# Patient Record
Sex: Male | Born: 1995 | Hispanic: Yes | Marital: Single | Smoking: Light tobacco smoker
Health system: Southern US, Community
[De-identification: ages and names within clinical notes are randomized; demographics above are authoritative.]

## PROBLEM LIST (undated history)

## (undated) DIAGNOSIS — T782XXA Anaphylactic shock, unspecified, initial encounter: Secondary | ICD-10-CM

## (undated) HISTORY — PX: ABDOMINAL SURGERY: SHX537

---
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
## 2017-08-19 ENCOUNTER — Other Ambulatory Visit: Payer: Self-pay

## 2017-08-19 ENCOUNTER — Encounter: Payer: Self-pay | Admitting: Emergency Medicine

## 2017-08-19 ENCOUNTER — Emergency Department
Admission: EM | Admit: 2017-08-19 | Discharge: 2017-08-19 | Disposition: A | Discharge status: Home / Self Care | Payer: Self-pay | Attending: Emergency Medicine | Admitting: Emergency Medicine

## 2017-08-19 DIAGNOSIS — T7840XA Allergy, unspecified, initial encounter: Secondary | ICD-10-CM

## 2017-08-19 DIAGNOSIS — R0602 Shortness of breath: Secondary | ICD-10-CM | POA: Insufficient documentation

## 2017-08-19 DIAGNOSIS — T63441A Toxic effect of venom of bees, accidental (unintentional), initial encounter: Secondary | ICD-10-CM | POA: Insufficient documentation

## 2017-08-19 DIAGNOSIS — R600 Localized edema: Secondary | ICD-10-CM | POA: Insufficient documentation

## 2017-08-19 DIAGNOSIS — F17200 Nicotine dependence, unspecified, uncomplicated: Secondary | ICD-10-CM | POA: Insufficient documentation

## 2017-08-19 MED ORDER — SODIUM CHLORIDE 0.9 % IV SOLN
Freq: Once | INTRAVENOUS | Status: AC
  Administered 2017-08-19: 16:00:00 via INTRAVENOUS

## 2017-08-19 MED ORDER — DIPHENHYDRAMINE HCL 50 MG/ML IJ SOLN
50.0000 mg | Freq: Once | INTRAMUSCULAR | Status: AC
  Administered 2017-08-19: 50 mg via INTRAVENOUS

## 2017-08-19 MED ORDER — EPINEPHRINE 0.3 MG/0.3ML IJ SOAJ: 0.3000 mg | Freq: Once | INTRAMUSCULAR | 0 refills | Status: AC

## 2017-08-19 MED ORDER — METHYLPREDNISOLONE SODIUM SUCC 125 MG IJ SOLR
INTRAMUSCULAR | Status: AC
  Administered 2017-08-19: 125 mg via INTRAVENOUS
  Filled 2017-08-19: qty 2

## 2017-08-19 MED ORDER — FAMOTIDINE IN NACL 20-0.9 MG/50ML-% IV SOLN
20.0000 mg | Freq: Once | INTRAVENOUS | Status: AC
  Administered 2017-08-19: 20 mg via INTRAVENOUS

## 2017-08-19 MED ORDER — EPINEPHRINE 0.3 MG/0.3ML IJ SOAJ
0.3000 mg | Freq: Once | INTRAMUSCULAR | Status: AC
  Administered 2017-08-19: 0.3 mg via INTRAMUSCULAR

## 2017-08-19 MED ORDER — METHYLPREDNISOLONE SODIUM SUCC 125 MG IJ SOLR
125.0000 mg | Freq: Once | INTRAMUSCULAR | Status: AC
  Administered 2017-08-19: 125 mg via INTRAVENOUS

## 2017-08-19 MED ORDER — FAMOTIDINE IN NACL 20-0.9 MG/50ML-% IV SOLN
INTRAVENOUS | Status: AC
  Administered 2017-08-19: 20 mg via INTRAVENOUS
  Filled 2017-08-19: qty 50

## 2017-08-19 MED ORDER — DIPHENHYDRAMINE HCL 50 MG/ML IJ SOLN
INTRAMUSCULAR | Status: AC
  Administered 2017-08-19: 50 mg via INTRAVENOUS
  Filled 2017-08-19: qty 1

## 2017-08-19 MED ORDER — EPINEPHRINE 0.3 MG/0.3ML IJ SOAJ
INTRAMUSCULAR | Status: AC
  Administered 2017-08-19: 0.3 mg via INTRAMUSCULAR
  Filled 2017-08-19: qty 0.3

## 2017-08-19 NOTE — ED Notes (Signed)
Pt resting more comfortably. Pts facial swelling and generalized hives have decreased in severity. ABCs intact. NAD. Pt denies needs/complaints. Family at bedside. Will continue to monitor.

## 2017-08-19 NOTE — ED Notes (Addendum)
MD at bedside. 

## 2017-08-19 NOTE — ED Triage Notes (Signed)
Pt presents to ED via POV c/o allergic reaction to bee sting. Pt reports being stung to R temple and hives appearing after. C/o shortness of breath, pt able to speak in full sentences without difficulty at this time. O2 99%.

## 2017-08-19 NOTE — ED Provider Notes (Signed)
[facility] Emergency Department Provider Note ____________________________________________   First MD Initiated Contact with Patient 08/19/17 1600     (approximate)  I have reviewed the triage vital signs and the nursing notes.   HISTORY  Chief Complaint Allergic Reaction    HPI Larry Yang is a 22 y.o. male with no significant PMH who presents with a little allergic reaction, acute onset within the last few minutes when the patient was stung by a bee on the face.  Patient reports generalized itching, as well as discomfort in his throat and swelling of his tongue.  He denies shortness of breath.  He denies any prior history of similar allergic reactions.  History reviewed. No pertinent past medical history.  There are no active problems to display for this patient.   History reviewed. No pertinent surgical history.  Prior to Admission medications   Medication Sig Start Date End Date Taking? Authorizing Provider  EPINEPHrine (EPIPEN 2-PAK) 0.3 mg/0.3 mL IJ SOAJ injection Inject 0.3 mLs (0.3 mg total) into the muscle once for 1 dose. 08/19/17 08/19/17  Dionne BucySiadecki, Sofiah Lyne, MD    Allergies Bee venom  History reviewed. No pertinent family history.  Social History Social History   Tobacco Use  . Smoking status: Light Tobacco Smoker  . Smokeless tobacco: Never Used  Substance Use Topics  . Alcohol use: Yes    Comment: occ.  . Drug use: Never    Review of Systems  Constitutional: No fever. Eyes: No visual changes. ENT: Positive for throat discomfort. Cardiovascular: Denies chest pain. Respiratory: Denies shortness of breath. Gastrointestinal: No vomiting.  Genitourinary: Negative for flank pain.  Musculoskeletal: Negative for back pain. Skin: Positive for itching. Neurological: Negative for headache.   ____________________________________________   PHYSICAL EXAM:  VITAL SIGNS: ED Triage Vitals  Enc Vitals Group     BP 08/19/17  1550 139/68     Pulse Rate 08/19/17 1550 (!) 152     Resp 08/19/17 1550 20     Temp 08/19/17 1550 98 F (36.7 C)     Temp Source 08/19/17 1550 Oral     SpO2 08/19/17 1550 100 %     Weight 08/19/17 1550 170 lb (77.1 kg)     Height 08/19/17 1550 5\' 4"  (1.626 m)     Head Circumference --      Peak Flow --      Pain Score 08/19/17 1551 10     Pain Loc --      Pain Edu? --      Excl. in GC? --     Constitutional: Alert and oriented.  Uncomfortable and anxious appearing.   Eyes: Conjunctivae are normal.  Head: Atraumatic. Nose: No congestion/rhinnorhea. Mouth/Throat: Mucous membranes are moist.  Oropharynx clear with no pooled secretions.  Uvula and posterior oropharynx with no swelling.  No significant tongue swelling. Neck: Normal range of motion.  Cardiovascular: Normal rate, regular rhythm. Grossly normal heart sounds.  Good peripheral circulation. Respiratory: Normal respiratory effort.  No retractions. Lungs CTAB. Gastrointestinal: No distention.  Musculoskeletal: Extremities warm and well perfused.  Neurologic:  Normal speech and language. No gross focal neurologic deficits are appreciated.  Skin:  Skin is warm and dry.  Mild diffuse erythema with no specific rash. Psychiatric: Mood and affect are normal. Speech and behavior are normal.  ____________________________________________   LABS (all labs ordered are listed, but only abnormal results are displayed)  Labs Reviewed - No data to display ____________________________________________  EKG   ____________________________________________  RADIOLOGY  ____________________________________________   PROCEDURES  Procedure(s) performed: No  Procedures  Critical Care performed: Yes  CRITICAL CARE Performed by: Dionne BucySebastian Mikahla Wisor   Total critical care time: 20 minutes  Critical care time was exclusive of separately billable procedures and treating other patients.  Critical care was necessary to treat or  prevent imminent or life-threatening deterioration.  Critical care was time spent personally by me on the following activities: development of treatment plan with patient and/or surrogate as well as nursing, discussions with consultants, evaluation of patient's response to treatment, examination of patient, obtaining history from patient or surrogate, ordering and performing treatments and interventions, ordering and review of laboratory studies, ordering and review of radiographic studies, pulse oximetry and re-evaluation of patient's condition.  ____________________________________________   INITIAL IMPRESSION / ASSESSMENT AND PLAN / ED COURSE  Pertinent labs & imaging results that were available during my care of the patient were reviewed by me and considered in my medical decision making (see chart for details).  22 year old male with no PMH presents with symptoms consistent with an allergic reaction to a bee sting on his face a few minutes prior to coming to the emergency department.  The patient reports generalized itching, as well as swelling in his face and discomfort in his throat and tongue.  On exam, the patient is uncomfortable appearing and slightly tachycardic.  His O2 saturation is 100%.  I have able to visualize the oropharynx with a tongue blade and there is no visible swelling.  Patient is swallowing his secretions normally, and his airway is intact.  The remainder of the exam is as described above.  Although the patient's airway is intact, due to the discomfort in his tongue and throat and concern for anaphylaxis I will give epinephrine, as well as Benadryl and steroid.  We will reassess, and observe for several hours.  ----------------------------------------- 7:45 PM on 08/19/2017 -----------------------------------------  The patient's symptoms improved after several minutes.  He is now been observed for almost 4 hours with no recurrence of symptoms.  He feels comfortable  to go home, and is eating a meal.  I will prescribe an EpiPen for the patient, and I discussed its usage with him.  Return precautions given, and he expresses understanding. ____________________________________________   FINAL CLINICAL IMPRESSION(S) / ED DIAGNOSES  Final diagnoses:  Allergic reaction, initial encounter      NEW MEDICATIONS STARTED DURING THIS VISIT:  New Prescriptions   EPINEPHRINE (EPIPEN 2-PAK) 0.3 MG/0.3 ML IJ SOAJ INJECTION    Inject 0.3 mLs (0.3 mg total) into the muscle once for 1 dose.     Note:  This document was prepared using Dragon voice recognition software and may include unintentional dictation errors.    Dionne BucySiadecki, Jalyah Weinheimer, MD 08/19/17 1946

## 2017-08-19 NOTE — Discharge Instructions (Signed)
Use the EpiPen if you ever have a similar allergic reaction involving any difficulty breathing, tightness in her throat, swelling of your tongue, or any similar symptoms.  Return to the ER for any new, worsening, or recurrent allergic reaction similar to this.  You may take Benadryl as needed for itching.

## 2021-07-14 ENCOUNTER — Emergency Department
Admission: EM | Admit: 2021-07-14 | Discharge: 2021-07-14 | Disposition: A | Discharge status: Home / Self Care | Payer: Self-pay | Attending: Emergency Medicine | Admitting: Emergency Medicine

## 2021-07-14 ENCOUNTER — Other Ambulatory Visit: Payer: Self-pay

## 2021-07-14 DIAGNOSIS — Z87891 Personal history of nicotine dependence: Secondary | ICD-10-CM | POA: Insufficient documentation

## 2021-07-14 DIAGNOSIS — T63441A Toxic effect of venom of bees, accidental (unintentional), initial encounter: Secondary | ICD-10-CM

## 2021-07-14 DIAGNOSIS — W57XXXA Bitten or stung by nonvenomous insect and other nonvenomous arthropods, initial encounter: Secondary | ICD-10-CM | POA: Insufficient documentation

## 2021-07-14 DIAGNOSIS — L509 Urticaria, unspecified: Secondary | ICD-10-CM | POA: Insufficient documentation

## 2021-07-14 HISTORY — DX: Anaphylactic shock, unspecified, initial encounter: T78.2XXA

## 2021-07-14 MED ORDER — DEXAMETHASONE SODIUM PHOSPHATE 10 MG/ML IJ SOLN
10.0000 mg | Freq: Once | INTRAMUSCULAR | Status: AC
  Administered 2021-07-14: 10 mg via INTRAVENOUS
  Filled 2021-07-14: qty 1

## 2021-07-14 MED ORDER — FAMOTIDINE IN NACL 20-0.9 MG/50ML-% IV SOLN: 20.0000 mg | Freq: Once | INTRAVENOUS | Status: AC

## 2021-07-14 MED ORDER — FAMOTIDINE 20 MG PO TABS: 20.0000 mg | ORAL_TABLET | Freq: Two times a day (BID) | ORAL | 0 refills | Status: AC

## 2021-07-14 MED ORDER — FAMOTIDINE IN NACL 20-0.9 MG/50ML-% IV SOLN
INTRAVENOUS | Status: AC
  Administered 2021-07-14: 20 mg via INTRAVENOUS
  Filled 2021-07-14: qty 50

## 2021-07-14 MED ORDER — METHYLPREDNISOLONE SODIUM SUCC 125 MG IJ SOLR
INTRAMUSCULAR | Status: AC
  Administered 2021-07-14: 125 mg
  Filled 2021-07-14: qty 2

## 2021-07-14 MED ORDER — EPINEPHRINE 0.3 MG/0.3ML IJ SOAJ: 0.3000 mg | INTRAMUSCULAR | 1 refills | Status: AC | PRN

## 2021-07-14 MED ORDER — PREDNISONE 10 MG PO TABS: ORAL_TABLET | ORAL | 0 refills | Status: DC

## 2021-07-14 MED ORDER — EPINEPHRINE 0.3 MG/0.3ML IJ SOAJ
0.3000 mg | Freq: Once | INTRAMUSCULAR | Status: AC
  Administered 2021-07-14: 0.3 mg via INTRAMUSCULAR

## 2021-07-14 MED ORDER — SODIUM CHLORIDE 0.9 % IV BOLUS
1000.0000 mL | Freq: Once | INTRAVENOUS | Status: AC
  Administered 2021-07-14: 1000 mL via INTRAVENOUS

## 2021-07-14 MED ORDER — DIPHENHYDRAMINE HCL 50 MG/ML IJ SOLN
INTRAMUSCULAR | Status: AC
  Administered 2021-07-14: 50 mg
  Filled 2021-07-14: qty 1

## 2021-07-14 NOTE — ED Triage Notes (Signed)
Pt arrives to ED after bee sting. Hives all over body. Airway clear. Epipen given R thigh and IV started.

## 2021-07-14 NOTE — Discharge Instructions (Addendum)
Continue taking the Pepcid twice a day until finished and start the prednisone tapering dose decreasing by 1 tablet each day.  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  Follow-up with your primary care provider.  If any bee stings you will need to be seen at the closest emergency department given your history of bee sting allergies.

## 2021-07-14 NOTE — ED Provider Notes (Signed)
On my exam patient stated that he did feel like his insides were getting better after the epi pen. Continues to have hives. Discussed with patient importance of letting staff know if he is starting to feel worse.    Phineas Semen, MD 07/14/21 1243

## 2021-07-14 NOTE — ED Provider Notes (Signed)
[facility] Provider Note    Event Date/Time   First MD Initiated Contact with Patient 07/14/21 [phone]     (approximate)   History   Urticaria and Allergic Reaction   HPI  Larry Yang is a 26 y.o. male   presents to the ED immediately after being stung by bee.  Patient has a history of allergic reactions and began having hives all over his body.  He states that in the past he was told to come immediately to the emergency department as he had a bad reaction but does not carry an EpiPen.  Currently patient states he is not having any difficulty talking or breathing.  He was able to take Benadryl 50 mg p.o. prior to arrival.  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      Physical Exam   Triage Vital Signs: ED Triage Vitals  Enc Vitals Group     BP      Pulse      Resp      Temp      Temp src      SpO2      Weight      Height      Head Circumference      Peak Flow      Pain Score      Pain Loc      Pain Edu?      Excl. in GC?     Most recent vital signs: Vitals:   07/14/21 1209 07/14/21 1230  BP: (!) 140/105 136/84  Pulse: (!) 110 100  Resp: 20 14  Temp:    SpO2: 97% 98%     General: Awake, no distress.  Able to answer questions in complete sentences without any respiratory difficulty. CV:  Good peripheral perfusion.  Resp:  Normal effort.  Minimal expiratory wheeze heard more in the right lung field.  Patient is able to talk in complete sentences without any difficulty. Abd:  No distention.  Other:  Moderate urticarial areas over the trunk, upper and lower extremities.  No oral edema or difficulty swallowing noted during exam.  No facial swelling.   ED Results / Procedures / Treatments   Labs (all labs ordered are listed, but only abnormal results are displayed) Labs Reviewed - No data to display    PROCEDURES:  Critical Care performed:   Procedures   MEDICATIONS ORDERED IN  ED: Medications  EPINEPHrine (EPI-PEN) injection 0.3 mg (0.3 mg Intramuscular Given 07/14/21 0748)  diphenhydrAMINE (BENADRYL) 50 MG/ML injection (50 mg  Given 07/14/21 0802)  methylPREDNISolone sodium succinate (SOLU-MEDROL) 125 mg/2 mL injection (125 mg  Given 07/14/21 0802)  dexamethasone (DECADRON) injection 10 mg (10 mg Intravenous Given 07/14/21 0806)  famotidine (PEPCID) IVPB 20 mg premix (0 mg Intravenous Stopped 07/14/21 0849)  sodium chloride 0.9 % bolus 1,000 mL (0 mLs Intravenous Stopped 07/14/21 1019)  sodium chloride 0.9 % bolus 1,000 mL (0 mLs Intravenous Stopped 07/14/21 1247)     IMPRESSION / MDM / ASSESSMENT AND PLAN / ED COURSE  I reviewed the triage vital signs and the nursing notes. ----------------------------------------- 12:33 PM on 07/14/2021 ----------------------------------------- Patient is feeling much better.  We discussed carrying an EpiPen with him at all times due to his allergies to bees.  Differential diagnosis includes, but is not limited to, anaphylaxis, urticaria, allergy to bee stings.  26 year old male presents to the ED immediately after being stung by a bee.  Patient has  a history of allergies and has a history of anaphylaxis as well.  Patient states that the last time he came to the emergency department he was not given an EpiPen however he did take Benadryl prior to arrival.  Patient was obviously having an allergic reaction to the bee sting and was given epi 0.3 mg immediately when he arrived to the exam room.  Also a IV was started with Pepcid 20 mg, Decadron 10 mg IV was given.  Also Solu-Medrol 125 mg and Benadryl were given.  Patient was reassessed multiple times frequently and did not have any continued oral swelling or difficulty breathing.  His urticarial lesions began fading and prior to discharge were completely gone.  O2 sats stayed at 97% or above and patient's vital signs were stable but slightly tachycardic when he came to the ED initially.   Patient was given IV hydration as he had been working outside and was covered with sweat as well.  Patient did well.  We discussed the importance of having an EpiPen and Benadryl with him.  He was reassured that he did the right thing by coming to the emergency department immediately.  He was discharged with prescription for Pepcid, prednisone taper and a prescription for an EpiPen.  He is aware that he will need to return to the emergency department should he develop any difficulty breathing immediately.      Patient's presentation is most consistent with acute presentation with potential threat to life or bodily function.  FINAL CLINICAL IMPRESSION(S) / ED DIAGNOSES   Final diagnoses:  Allergic reaction to bee sting     Rx / DC Orders   ED Discharge Orders          Ordered    EPINEPHrine (EPIPEN 2-PAK) 0.3 mg/0.3 mL IJ SOAJ injection  As needed        07/14/21 1237    famotidine (PEPCID) 20 MG tablet  2 times daily        07/14/21 1237    predniSONE (DELTASONE) 10 MG tablet        07/14/21 1237             Note:  This document was prepared using Dragon voice recognition software and may include unintentional dictation errors.   Tommi Rumps, PA-C 07/14/21 1510    Phineas Semen, MD 07/14/21 [id number]

## 2021-07-14 NOTE — ED Notes (Signed)
Pt up to bathroom. Hives are gone. Pt states he feels jittery.

## 2021-07-14 NOTE — ED Notes (Signed)
ED provider at bedside.

## 2021-07-16 ENCOUNTER — Telehealth: Payer: Self-pay | Admitting: Emergency Medicine

## 2022-05-17 ENCOUNTER — Encounter: Payer: Self-pay | Admitting: Emergency Medicine

## 2022-05-17 ENCOUNTER — Emergency Department
Admission: EM | Admit: 2022-05-17 | Discharge: 2022-05-17 | Discharge status: Against Medical Advice | Payer: Self-pay | Attending: Emergency Medicine | Admitting: Emergency Medicine

## 2022-05-17 DIAGNOSIS — Z5321 Procedure and treatment not carried out due to patient leaving prior to being seen by health care provider: Secondary | ICD-10-CM | POA: Insufficient documentation

## 2022-05-17 DIAGNOSIS — R21 Rash and other nonspecific skin eruption: Secondary | ICD-10-CM | POA: Insufficient documentation

## 2022-05-17 DIAGNOSIS — R1030 Lower abdominal pain, unspecified: Secondary | ICD-10-CM | POA: Insufficient documentation

## 2022-05-17 LAB — CBC
HCT: 43.1 % (ref 39.0–52.0)
Hemoglobin: 14.8 g/dL (ref 13.0–17.0)
MCH: 29.6 pg (ref 26.0–34.0)
MCHC: 34.3 g/dL (ref 30.0–36.0)
MCV: 86.2 fL (ref 80.0–100.0)
Platelets: 322 10*3/uL (ref 150–400)
RBC: 5 MIL/uL (ref 4.22–5.81)
RDW: 12.6 % (ref 11.5–15.5)
WBC: 9.4 10*3/uL (ref 4.0–10.5)
nRBC: 0 % (ref 0.0–0.2)

## 2022-05-17 LAB — URINALYSIS, ROUTINE W REFLEX MICROSCOPIC
Bilirubin Urine: NEGATIVE
Glucose, UA: NEGATIVE mg/dL
Hgb urine dipstick: NEGATIVE
Ketones, ur: NEGATIVE mg/dL
Leukocytes,Ua: NEGATIVE
Nitrite: NEGATIVE
Protein, ur: NEGATIVE mg/dL
Specific Gravity, Urine: >1.03 — ABNORMAL HIGH (ref 1.005–1.030)
Squamous Epithelial / HPF: NONE SEEN /HPF (ref 0–5)
pH: 5 (ref 5.0–8.0)

## 2022-05-17 LAB — COMPREHENSIVE METABOLIC PANEL
ALT: 52 U/L — ABNORMAL HIGH (ref 0–44)
AST: 23 U/L (ref 15–41)
Albumin: 4 g/dL (ref 3.5–5.0)
Alkaline Phosphatase: 81 U/L (ref 38–126)
Anion gap: 9 (ref 5–15)
BUN: 10 mg/dL (ref 6–20)
CO2: 24 mmol/L (ref 22–32)
Calcium: 8.5 mg/dL — ABNORMAL LOW (ref 8.9–10.3)
Chloride: 106 mmol/L (ref 98–111)
Creatinine, Ser: 0.92 mg/dL (ref 0.61–1.24)
GFR, Estimated: >60 mL/min (ref >60)
Glucose, Bld: 119 mg/dL — ABNORMAL HIGH (ref 70–99)
Potassium: 3.5 mmol/L (ref 3.5–5.1)
Sodium: 139 mmol/L (ref 135–145)
Total Bilirubin: 0.6 mg/dL (ref 0.3–1.2)
Total Protein: 6.8 g/dL (ref 6.5–8.1)

## 2022-05-17 LAB — LIPASE, BLOOD: Lipase: 41 U/L (ref 11–51)

## 2022-05-17 NOTE — ED Notes (Signed)
No answer when called several times from lobby 

## 2022-05-17 NOTE — ED Triage Notes (Signed)
Pt presents ambulatory to triage via POV with complaints of lower abdominal pain for the last several days. He notes a rash tot the R side of his abdomen for the last month. Pt has no visible rash on his stomach at this time but has stretch marks that are red in color. Pt denies any pain at this time but wanted to get "checked out". A&Ox4 at this time. Denies CP or SOB.

## 2022-07-05 ENCOUNTER — Emergency Department
Admission: EM | Admit: 2022-07-05 | Discharge: 2022-07-05 | Disposition: A | Discharge status: Home / Self Care | Payer: Self-pay | Attending: Emergency Medicine | Admitting: Emergency Medicine

## 2022-07-05 ENCOUNTER — Other Ambulatory Visit: Payer: Self-pay

## 2022-07-05 DIAGNOSIS — T782XXA Anaphylactic shock, unspecified, initial encounter: Secondary | ICD-10-CM | POA: Insufficient documentation

## 2022-07-05 MED ORDER — FAMOTIDINE IN NACL 20-0.9 MG/50ML-% IV SOLN
20.0000 mg | Freq: Once | INTRAVENOUS | Status: AC
  Administered 2022-07-05: 20 mg via INTRAVENOUS
  Filled 2022-07-05: qty 50

## 2022-07-05 MED ORDER — EPINEPHRINE 0.3 MG/0.3ML IJ SOAJ: 0.3000 mg | INTRAMUSCULAR | 3 refills | Status: DC | PRN

## 2022-07-05 MED ORDER — DIPHENHYDRAMINE HCL 50 MG/ML IJ SOLN
50.0000 mg | INTRAMUSCULAR | Status: AC
  Administered 2022-07-05: 50 mg via INTRAVENOUS
  Filled 2022-07-05: qty 1

## 2022-07-05 MED ORDER — EPINEPHRINE 0.3 MG/0.3ML IJ SOAJ
INTRAMUSCULAR | Status: AC
  Filled 2022-07-05: qty 0.3

## 2022-07-05 MED ORDER — SODIUM CHLORIDE 0.9 % IV BOLUS
1000.0000 mL | Freq: Once | INTRAVENOUS | Status: AC
  Administered 2022-07-05: 1000 mL via INTRAVENOUS

## 2022-07-05 MED ORDER — METHYLPREDNISOLONE SODIUM SUCC 125 MG IJ SOLR
125.0000 mg | INTRAMUSCULAR | Status: AC
  Administered 2022-07-05: 125 mg via INTRAVENOUS
  Filled 2022-07-05: qty 2

## 2022-07-05 MED ORDER — EPINEPHRINE 0.3 MG/0.3ML IJ SOAJ
0.3000 mg | Freq: Once | INTRAMUSCULAR | Status: AC
  Administered 2022-07-05: 0.3 mg via INTRAMUSCULAR

## 2022-07-05 MED ORDER — PREDNISONE 20 MG PO TABS: 40.0000 mg | ORAL_TABLET | Freq: Every day | ORAL | 0 refills | Status: AC

## 2022-07-05 NOTE — ED Provider Notes (Signed)
[facility] Provider Note    Event Date/Time   First MD Initiated Contact with Patient 07/05/22 1807     (approximate)   History   Chief Complaint: Allergic Reaction   HPI  Larry Yang is a 27 y.o. male with a past history of bee allergy who reports being stung by a bee twice today, and the front of the throat and on the upper lip.  Reports trouble breathing.  No vomiting.  No rash.  Does have localized swelling in those areas.  Ran out of EpiPen at home and has not taken any medication so far.  Exposure happened just prior to arrival.     Physical Exam   Triage Vital Signs: ED Triage Vitals  Enc Vitals Group     BP 07/05/22 1800 (!) 155/93     Pulse Rate 07/05/22 1759 85     Resp 07/05/22 1759 17     Temp 07/05/22 1759 99.3 F (37.4 C)     Temp Source 07/05/22 1759 Oral     SpO2 07/05/22 1759 100 %     Weight 07/05/22 1801 280 lb (127 kg)     Height 07/05/22 1801 5\' 4"  (1.626 m)     Head Circumference --      Peak Flow --      Pain Score --      Pain Loc --      Pain Edu? --      Excl. in GC? --     Most recent vital signs: Vitals:   07/05/22 2200 07/05/22 2230  BP: 124/71 (!) 137/92  Pulse: 72 62  Resp: 19 18  Temp:    SpO2: 97% 95%    General: Awake, no distress.  CV:  Good peripheral perfusion.  Regular rate rhythm Resp:  Normal effort.  Clear to auscultation, no wheezing or stridor Abd:  No distention.  Soft nontender Other:  There is edema of the upper lip as well as a 4 cm area of swelling over the area of the thyroid cartilage.  Oropharynx is clear.  No tongue elevation or edema.   ED Results / Procedures / Treatments   Labs (all labs ordered are listed, but only abnormal results are displayed) Labs Reviewed - No data to display   EKG    RADIOLOGY    PROCEDURES:  Procedures   MEDICATIONS ORDERED IN ED: Medications  EPINEPHrine (EPI-PEN) injection 0.3 mg (0.3 mg Intramuscular Given 07/05/22 1808)   diphenhydrAMINE (BENADRYL) injection 50 mg (50 mg Intravenous Given 07/05/22 1816)  methylPREDNISolone sodium succinate (SOLU-MEDROL) 125 mg/2 mL injection 125 mg (125 mg Intravenous Given 07/05/22 1816)  famotidine (PEPCID) IVPB 20 mg premix (0 mg Intravenous Stopped 07/05/22 1856)  sodium chloride 0.9 % bolus 1,000 mL (0 mLs Intravenous Stopped 07/05/22 2113)     IMPRESSION / MDM / ASSESSMENT AND PLAN / ED COURSE  I reviewed the triage vital signs and the nursing notes.    Patient's presentation is most consistent with acute presentation with potential threat to life or bodily function.  Patient presents with symptoms of anaphylactic reaction to bee/wasp sting.  Will give EpiPen, Solu-Medrol, Benadryl, Pepcid and observe in ED.  Vital signs are normal.   Clinical Course as of 07/05/22 2301  Tue Jul 05, 2022  2000 Patient reassessed.  No worsening of swelling.  Lungs clear to auscultation without wheezing or stridor.  tissues around the neck are soft.  Voice is strong and clear [PS]  2236  Feeling better.  Edema improved.  Lungs clear to auscultation bilaterally, oropharynx clear.  Stable for discharge. [PS]    Clinical Course User Index [PS] Sharman Cheek, MD     FINAL CLINICAL IMPRESSION(S) / ED DIAGNOSES   Final diagnoses:  Anaphylaxis, initial encounter     Rx / DC Orders   ED Discharge Orders          Ordered    EPINEPHrine 0.3 mg/0.3 mL IJ SOAJ injection  As needed        07/05/22 2236    predniSONE (DELTASONE) 20 MG tablet  Daily with breakfast        07/05/22 2236    Ambulatory Referral to Primary Care (Establish Care)        07/05/22 2236             Note:  This document was prepared using Dragon voice recognition software and may include unintentional dictation errors.   Sharman Cheek, MD 07/05/22 2302

## 2022-07-05 NOTE — ED Triage Notes (Signed)
Pt sts that he was stung by a but and is having swelling to the throat and lips. Pt sts that he is having trouble breathing at this time. Pt is A/Ox4

## 2022-07-05 NOTE — ED Notes (Addendum)
Pt disconnected to go to the restroom. States that he is beginning to feel better

## 2022-07-05 NOTE — ED Notes (Signed)
Pt reports he fells like his throat is swelling more. MD notified. Airway remains patent. No difficulty breathing. Pt still able to swallow

## 2022-07-05 NOTE — ED Notes (Signed)
Pt reports symptoms continually improving

## 2022-07-05 NOTE — ED Notes (Signed)
Pt given water with MD's approval

## 2022-11-22 NOTE — ED Provider Notes (Signed)
 ED Attending Note [facility] Emergency Department  I saw the patient on 11/23/2022.    Patient Name: Larry Yang Chief Complaint: Abdominal Pain    ED Clinical Impression   Final diagnoses:  Sigmoid diverticulitis (Primary)       Medical Decision Making, Impression, ED Course, Assessment and Plan    -I have reviewed the outside/prior medical records including 02/10/2022 [facility] SurgTrauma Discharge Summary Note for patient previous history of similar symptoms in the setting of a perirectal abscess which was surgically removed.  -Imaging reviewed and interpreted by me showed acute sigmoid diverticulitis. -Laboratory workup reviewed by me showed leukocytosis to 18.2. Urinalysis was remarkable for mucus. CMP reveals elevated total protein to 8.3. Lipase is WNL.  -MAO was consulted to aid in the evaluation and management of this patient  -Patient/caregiver discussions:  -Social Determinants of health which significantly affected care: N/A  -Indications for observation/admission (or consideration of observation/admission) and/or appropriateness for outpatient management: Patient is appropriate for admission given the findings noted in the ED course and MDM.     Larry Yang is a 27 y.o. male with no pertinent PMH presenting with 3 days of sharp, worsening lower abdominal pain which was initially intermittent but is now constant with associated dysuria, hematuria, emesis, diarrhea, and reduced PO intake. Has left lower quadrant tenderness palpation with mild rebound tenderness. Initially afebrile though did become febrile here. Differential diagnosis is brought includes recurrent intra-abdominal abscess, diverticulitis, inflammatory bowel disease, urinary tract infection or stone. Given focal tenderness on exam will obtain CT imaging. Laboratory workup is notable for leukocytosis.   ED Course:  ED Course as of 11/23/22 0029  Tue Nov 22, 2022  2131 This patient  now with fever and persistent pain. Will start IV antibiotics given fever and concern for early sepsis and plan on admission for IV antibiotics and pain control  CT imaging demonstrated findings consistent with sigmoid diverticulitis. There is no evidence of perforation or abscess, but given patient's fever and significant pain, does warrant admission for IV antibiotics, pain control and further care. Have discussed with hospitalist Dr. Jackson.    History  History obtained from: Patient  Larry Yang is a 27 y.o. male with no pertinent PMH presenting with abdominal pain. The patient reports 3 days of sharp, worsening lower abdominal pain which was initially intermittent but is now constant. He states he experienced several episodes of emesis and diarrhea on the day his pain onset but none in the days that followed due to significantly reduced PO intake. Upon interview, he endorses dysuria, as well as hematuria which decreased after the first day of his pain. He also notes bilateral lower back pain which onset simultaneously with his abdominal pain. He denies tobacco or EtOH use. He is sexually active with 1 partner and does not use contraceptives. No recent cough, congestion, sore throat, or fevers.   Per chart review, the patient was seen in this ED on 02/06/2022 for similar symptoms in addition to perirectal pain. Imaging revealed a 3 cm perirectal abscess. He was treated with Zosyn and admitted for I&D on 02/07/2022. He tolerated the procedure well. At discharge, he was transitioned to a 7-day course of Augmentin.   Past Medical/Past Surgical History:  Reviewed in EHR including nursing documentation as outlined. Pertinent PMH/PSH also noted above in HPI.  No past medical history on file. Patient Active Problem List  Diagnosis  . Diverticulitis   Past Surgical History:  Procedure Laterality Date  .  PR I&D PERIRECTAL ABSCESS N/A 02/07/2022   Procedure: INCISION AND DRAINAGE OF  ISCHIORECTAL AND/OR PERIRECTAL ABSCESS (SEPARATE PROCEDURE);  Surgeon: Karyle Donnice Pac, MD;  Location: MAIN OR [facility];  Service: Trauma  . PR PROCTOSIGMOIDOSCOPY,BIOPSY Midline 02/07/2022   Procedure: PROCTOSIGMOIDOSCOPY RIGID; W/BX 1/MX;  Surgeon: Karyle Donnice Pac, MD;  Location: MAIN OR [facility];  Service: Trauma    Social History/Family History:  Reviewed in EMR, I agree with nursing documentation. Additional pertinent social and family history noted in HPI.  Social History   Tobacco Use  . Smoking status: Never  . Smokeless tobacco: Never  Vaping Use  . Vaping status: Never Used  Substance Use Topics  . Alcohol use: Yes    Comment: pccasopma;  . Drug use: No   History reviewed. No pertinent family history.  ROS A review of systems reviewed and are negative except as stated in the HPI or noted below. Home Medications: No current facility-administered medications for this encounter.  Current Outpatient Medications:  .  acetaminophen  (TYLENOL ) 500 MG tablet, Take 2 tablets (1,000 mg total) by mouth every eight (8) hours as needed for pain., Disp: 40 tablet, Rfl: 0 .  albuterol  2.5 mg /3 mL (0.083 %) nebulizer solution, Inhale 3 mL (2.5 mg total) by nebulization every four (4) hours as needed for wheezing or shortness of breath for up to 10 doses., Disp: 30 mL, Rfl: 0 .  albuterol  HFA 90 mcg/actuation inhaler, Inhale 2 puffs every four (4) hours as needed for wheezing., Disp: 8 g, Rfl: 2 .  EPINEPHrine  (EPIPEN ) 0.3 mg/0.3 mL injection, Inject 0.3 mL (0.3 mg total) into the muscle., Disp: , Rfl:  .  fluticasone propionate (FLONASE) 50 mcg/actuation nasal spray, 2 sprays into each nostril daily., Disp: 16 g, Rfl: 0 .  gabapentin (NEURONTIN) 300 MG capsule, Take 1 capsule (300 mg total) by mouth Three (3) times a day., Disp: 90 capsule, Rfl: 0 .  HYDROmorphone  (DILAUDID ) 2 MG tablet, Take 1-2 tablets by mouth every 6 hours as needed for severe pain., Disp: 20 tablet, Rfl:  0  ALLERGIES:  Venom-honey bee    Physical Exam   ED Triage Vitals  Enc Vitals Group     BP 11/22/22 1426 158/84     Heart Rate 11/22/22 1420 128     SpO2 Pulse --      Resp 11/22/22 1425 18     Temp 11/22/22 1425 36.6 C (97.8 F)     Temp Source 11/22/22 1425 Oral     SpO2 11/22/22 1420 95 %     Weight 11/22/22 1426 95.3 kg (210 lb)     Height 11/22/22 1426 1.626 m (5' 4)    Vital signs reviewed. GENERAL: well-appearing in no acute distress  HEENT: airway is patent, moist mucus membranes, oropharynx clear without lesions, exudates, thrush EYES: pupils equal and reactive to light bilaterally, no scleral icterus, conjunctivae clear CARDIAC: normal rate, regular rhythm, normal S1 and S2, 2+ peripheral pulses, normal capillary refill PULMONARY: normal work of breathing on room air, good air movement bilaterally, lungs are clear to auscultation bilaterally without wheezes, crackles, rhonchi ABDOMINAL: normal bowel sounds, abdomen soft, +LLQ TTP and suprapubic region with +rebound, nondistended, no hepatomegaly or splenomegaly MSK: no joint deformity or swelling, moves all 4 extremities without difficulty, no lower extremity edema NEURO: alert and oriented 3, normal mental status, PERRL, EOMI, moves all four extremities equally, normal gait  SKIN: Warm and dry without rashes, no jaundice    Labs and Radiology  Labs Reviewed  COMPREHENSIVE METABOLIC PANEL - Abnormal; Notable for the following components:      Result Value   Total Protein 8.3 (*)    All other components within normal limits  CBC W/ AUTO DIFF - Abnormal; Notable for the following components:   WBC 18.2 (*)    Absolute Neutrophils 15.1 (*)    Absolute Monocytes 1.4 (*)    All other components within normal limits  URINALYSIS WITH MICROSCOPY WITH CULTURE REFLEX PERFORMABLE - Abnormal; Notable for the following components:   Mucus, UA Rare (*)    All other components within normal limits  LIPASE - Normal   CBC W/ DIFFERENTIAL   Narrative:    The following orders were created for panel order CBC w/ Differential.                 Procedure                               Abnormality         Status                                    ---------                               -----------         ------                                    CBC w/ [id number]]         Abnormal            Final result                                               Please view results for these tests on the individual orders.  URINALYSIS WITH MICROSCOPY WITH CULTURE REFLEX   Narrative:    The following orders were created for panel order Urinalysis with Microscopy with Culture Reflex.                 Procedure                               Abnormality         Status                                    ---------                               -----------         ------                                    Urinalysis with Microsc.SABRASABRA[[id number]]  Abnormal            Final result  Please view results for these tests on the individual orders.   CT Abdomen Pelvis W Contrast  Final Result    Acute sigmoid diverticulitis. Given lack unusual chronological age for this pathology, consider gastroenterology referral to discuss utility for colonoscopy once symptoms have resolved to rule out underlying lesion.    Hepatic steatosis          Procedures  Procedures    Portions of this record have been created using Scientist, clinical (histocompatibility and immunogenetics). Dictation errors have been sought, but may not have been identified and corrected.   Documentation assistance was provided by Coley Emms, Scribe, on November 22, 2022 at 6:04 PM for Leita Chancy, MD  Documentation assistance was provided by the scribe in my presence.  The documentation recorded by the scribe has been reviewed by me and accurately reflects the services I personally performed.    Chancy Leita RAMAN, MD 11/23/22  [phone]

## 2022-11-23 DIAGNOSIS — J4520 Mild intermittent asthma, uncomplicated: Secondary | ICD-10-CM | POA: Insufficient documentation

## 2023-03-06 NOTE — Discharge Summary (Signed)
 ------------------------------------------------------------------------------- Attestation signed by Cass Norleen Rocks, MD at 03/09/23 [phone] I personally evaluated this patient and agree with findings and plan above.  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 *   OR Procedures:   CLOSURE ENTEROSTOMY LG/SM INTESTINE REPAIR OF PARASTOMAL HERNIA, ANY APPROACH (IE, OPEN, LAPAROSCOPIC, ROBOTIC), INITIAL OR RECURRENT, INCLUDING IMPLANTATION OF MESH OR OTHER PROSTHESIS, WHEN PERFORMED; REDUCIBLE Date 03/03/2023 -------------------   Ancillary Procedures: no procedures  Discharge Day Services:   Subjective  No acute events overnight. No fever or chills. Would like to discharge with PO Dilaudid .  Objective  Patient Vitals for the past 8 hrs:  BP Temp Temp src Pulse Resp SpO2  03/07/23 0345 131/92 36.8 C (98.2 F) Oral 75 17 99 %   No intake/output data recorded.  -General:  Appropriate, comfortable and in no apparent distress.  -Neurological: Alert and oriented x3. Moves all 4 extremities spontaneously.  -HEENT: Atraumatic -Cardiovascular: Regular rate -Pulmonary: Normal work of breathing on room air -Abdomen: Soft, non-distended, appropriately tender. No rebound or guarding. Dressing clean, dry, intact. Gauze removed from ostomy site. Ostomy site covered with gauze and tape. -Extremities: Warm, well perfused, normal skin  turgor.  Hospital Course:  Larry Yang is a 28 y.o. male with PMHx of perforated diverticulitis status post diverting loop ileostomy who presented for planned DLI TD on 03/03/2023. Procedures performed were diverting loop ileostomy takedown and primary repair of parastomal hernia. Operative findings were successfully reversed ileostomy; parastomal hernia noted and closed primarily.  The procedure itself was uneventful and without complications. He tolerated the procedure well, was extubated in the OR, and received routine post-operative care before being transferred to the floor. His diet was advanced and by discharge he was tolerating a regular diet. He was voiding adequately, passing gas, having bowel movements, ambulating independently, and his pain was controlled with PO pain medications. He was examined by the Trauma Surgery team on the day of discharge and was deemed suitable for discharge home. He will be discharged 3 Days Post-Op in good condition. He was instructed to cover his ostomy site with 4x4 gauze and tape.  Instructions  DIET: You may eat a regular diet.  ACTIVITY: You may shower 48 hours (2 days) after the procedure, but please avoid baths, hot tubs, pools, soaking or sitting in water for 2 weeks.  Please allow the soap and water to run over your incision.  Do not scrub your incision.     No lifting greater than 10 pounds or strenuous activity for 2 weeks then nothing over 20 pounds from 2-6 weeks. If feeling pulling at incision back off.  Continue restrictions until until seen in clinic.  INCISION: Look at your incisions at least twice a day. A small amount of drainage is normal, especially in the first couple of days after surgery. If you notice any redness around your incisions that spreads along your skin, or any thick, yellow drainage, you should call the clinic. The surgical glue that was placed over your incisions will dissolve naturally over time. Do not put any  ointments or cream over the glue, and do not pull the glue off or pick at it. Avoid wearing tight clothing.  Continue to cover your ostomy site with dry gauze and tape.  PAIN MEDICATION: Do not drive or drink alcohol while taking prescription pain medication. Take your prescribed pain medication only as needed. You may decrease the amount of pain medication as tolerated, and take Tylenol  or a Motrin product as directed on the label if have tolerated these medications before and have not been told by another provider not to take them.  Pain medication can cause constipation. To avoid this, you should take an over-the-counter stool softener, such as docusate 100mg  1-2 times daily.   Effective July 02, 2015, the [place] signed a [place]  law that works to address the opioid crisis our state is facing.  It is called the STOP Law, for Strengthen Opioid Misuse Prevention.     As part of this law, we are limited to prescribing no more than a seven-day supply of opioid pain medicine for our surgery patients initally.  (This law also limits prescriptions for patients who have not had surgery to a five-day supply.)   If you are in need of additional pain control, the best number to call is [phone] before your current medications run out.  For after hours and weekends, please call [phone] and ask to speak with the surgery resident on call. The on-call provider may NOT be able to renew your prescription.   The STOP Act also monitors the amount of controlled substances that each patient has received from any and all providers.  Prescriptions for each patient will be monitored by the  Controlled Substances Reporting System in accordance with the law.  Thank you for working with us  so we can provide good pain control in the safest way possible while you recover.   Please note that prescription pain medication refills will not be called into your pharmacy. If you feel that you are needing more pain  medication, you will need to be seen in clinic or the Emergency Department for further evaluation to ensure you are not experiencing a complication. Please take note of how many pills you have left in your bottle, and if you are starting to run low and feel that you will need more for adequate pain control, to call the clinic as soon as possible to schedule an appointment, as our clinics are very busy, and we cannot guarantee same week appointments.  The Trauma Surgery office number is [phone] (link: [phone]). For emergencies at night or on the week-end, please call [phone]) and ask for the surgery resident on call.  WHEN TO CALL THE SURGEON'S OFFICE: 1. Thick, yellow drainage from your incisions, redness at incisions, bleeding, or separations of wounds. 2. Temperature greater than 101.5 3. Uncontrolled nausea or vomiting. 4. Pain that is not controlled by your pain medication  You may take Motrin 600mg  with breakfast, lunch, dinner and before bed time to help with pain.   Standard Patient Notification Script We care deeply about how our patients' pain is managed after surgery.  During the month after you have surgery, you may receive a call from us  asking about your postsurgical pain and about how you managed that pain.  The survey will take approximately 5 to 10 minutes and will help us  to provide better care to our patients.  Thank you in advance for your participation and please answer any calls coming from the [facility] or  [place] area!  Follow Up  Important Information and Numbers:  [facility] Department of Surgery, Division of General and Acute Care Surgery [address] [facility] - First Floor [place], [place]  [address] During regular business hours: (Monday-Friday, 8am-4:30 pm) call,   Contact Information for Nurses: [phone]: [place] [phone]: [place] (Dr. Francella) [phone]: Office (if you need to change your OR Date) Fax: [phone]  Clinic Number: To schedule, reschedule or cancel an appointment:  [phone]) [phone] - You will be scheduled for an appointment in 2 weeks.  During Nights and Weekends will need to reach Resident on Call: [phone]  If you have any FMLA or other paperwork that needs filled out related to work, school, etc., that hasn't been filled out during your hospitalization, please fax to the number listed above [phone]) instead of bringing to your clinic appointment and this will help decrease any delays in paperwork completion.  We will schedule a follow-up appointment for approximately 2 weeks from discharge. If you have not been contacted regarding an follow-up appointment date and time within a few days, please call to schedule.   Please note, if you arrive more than 20 minutes late, you will need to reschedule your appointment  *For support and information on substance abuse you may call the [facility]. It is free, confidential 24/7, 365 day-a-year treatment referral and information service (in [place] and [place]) for individuals and families facing mental and/or substance use disorders. 1-800-662-HELP (4357).   Condition at Discharge: Improved Discharge Medications:    Medication List    START taking these medications   . celecoxib 200 MG capsule; Commonly known as: CeleBREX; Take 1 capsule  (200 mg total) by mouth two (2) times a day for 3 days. SABRA gabapentin 300 MG capsule; Commonly known as: NEURONTIN; Take 1 capsule  (300 mg total) by mouth Three (3) times a day. . HYDROmorphone  2 MG tablet; Commonly known as: DILAUDID ; Take 1 tablet (2  mg total) by mouth every four (4) hours as needed for severe pain for up  to 5 days. SABRA lidocaine 2 % Soln; Commonly known as: XYLOCAINE; 15 mL by Mouth route  every four (4) hours as needed (mouth pain, switch and spt). . methocarbamol 1,000 mg Tab; Take 1,000 mg by mouth four (4) times a day.   CHANGE how you take these  medications   . acetaminophen  500 MG tablet; Commonly known as: TYLENOL ; Take 2 tablets  (1,000 mg total) by mouth every six (6) hours.; What changed: when to take  this . EPINEPHrine  0.3 mg/0.3 mL injection; Commonly known as: EPIPEN ; Inject  0.3 mL (0.3 mg total) into the muscle once as needed for anaphylaxis for  up to 1 dose.; What changed: when to take this, reasons to take this   CONTINUE taking these medications   . * albuterol  90 mcg/actuation inhaler; Commonly known as: PROVENTIL   HFA;VENTOLIN  HFA; Inhale 2 puffs every four (4) hours as needed for  wheezing. . * albuterol  2.5 mg /3 mL (0.083 %) nebulizer solution; Inhale 3 mL (2.5  mg total) by nebulization every four (4) hours as needed for wheezing or  shortness of breath for up to 10 doses. * This list has 2 medication(s) that are the same as other medications  prescribed for you. Read the directions carefully, and ask your doctor or  other care provider to review them with you.   STOP taking these medications   . ADAPT STOMA POWDER Powd; Generic  drug: ostomy supplies . COLOPLAST BARRIER RING 2  Misc; Generic drug: colostomy washer . NEW IMAGE DRAINABLE POUCH 2 1/4  Misc; Generic drug: colostomy bags . NEW IMAGE DRAINABLE POUCH 2 3/4  Misc . NEW IMAGE FLEXTEND BARRIER 2 1/4  Misc; Generic drug: ostomy supplies . NO STING BARRIER FILM Padm; Generic drug: skin protectants, misc. . OSTOMY BELT LARGE Misc; Generic drug: colostomy belt . ostomy supplies Misc . SENSI-CARE ADHESI REMOVER WIPE Misc; Generic drug: adhesive remover    Pending Test Results: Surgical pathology exam

## 2023-04-13 NOTE — Progress Notes (Signed)
 [facility], ACUTE CARE, and GENERAL SURGERY  - Follow-up Appointment - 04/13/2023    Patient: Larry Yang DOB: 08/12/1995 Primary Care Provider:  Pcp, None Per Patient   CC: Back pain  Hospitalization for: Diverting loop ileostomy reversal on 03/03/23 Discharged on: 03/06/23 Last seen in clinic on 03/31/23 by Suzen Denis, NP  Subjective:   Since he was last seen in clinic on 03/31/23 he continues to eat well and have 2 regular bowel movements daily. Denies pain with bowel movements, nausea or emesis. Denies fevers, chills.   Reports worsening back pain over the past week after moving furniture across the room. Worried that it is a problem with his bowel and the ostomy take down site. Reports feeling muscle strain across his anterior abdomen and concerned he has an abscess or tumor. Worried he needs a CT scan to ensure he doesn't have cancer as his friend did and had a stoma for that reason. He is walking extensively, has not returned to work because he feels like his gait is abnormal since the initial surgery.   Is not taking tylenol  or ibuprofen, taking Gabapentin prescribed at last appointment.   BP 113/71 (BP Position: Sitting, BP Cuff Size: Large)   Pulse 88   Temp 36.4 C (97.6 F) (Temporal)   Ht 165.1 cm (5' 5)   Wt 90 kg (198 lb 8 oz)   BMI 33.03 kg/m  Estimated body mass index is 33.03 kg/m as calculated from the following:   Height as of this encounter: 165.1 cm (5' 5).   Weight as of this encounter: 90 kg (198 lb 8 oz).   Physical Exam:  General: Well appearing male, well-nourished, resting comfortably in no acute distress Eyes: Sclera anicteric, EOM intact ENT: Nares without discharge, moist mucous membranes, trachea midline  Cardiac: Regular rate and rhythm, radial pulse 2+ Pulmonary: Non labored breathing, stable on room air Abdomen: Soft, non-tender, non distended, previous lower midline scar well healed, right sided ostomy takedown site  incision well healed, no erythema, no bulge, no recurrent hernia Extremities: Warm and well perfused, gait appears normal  Neuro: Alert and oriented x3   Imaging Results: - None    Assessment & Plan:   28 yo M w/ hx of perforated diverticulitis s/p Open sigmoidectomy with DLI (11/2022) now s/p diverting loop ileostomy reversal on 03/03/23 who is recovering well post-operatively.   - Discussed extensively that he is doing well from a surgical standpoint, he is eating and having bowel function, incisions are well healed and there are no concerns clinically or on exam. Discussed his surgeries were done for diverticulitis and there is no evidence of cancer. Discussed that he does not need further imaging at this time and his long term back pain needs further follow up with a PCP.  - Take tylenol /ibuprofen scheduled for the next week to assist with back muscle strain  - No further lifting restrictions, can slowly increase your lifting and activity  - Ok to swim/soak in tub - New abdominal binder provided - Referral to PCP given lack of PCP and ongoing questions about back pain - Follow up in clinic on an as needed basis at this time   Lamarr MARLA Mayer, MD [facility] General Surgery, PGY5

## 2023-08-05 ENCOUNTER — Observation Stay (HOSPITAL_BASED_OUTPATIENT_CLINIC_OR_DEPARTMENT_OTHER)
Admit: 2023-08-05 | Discharge: 2023-08-05 | Disposition: A | Discharge status: Home / Self Care | Payer: Self-pay | Attending: Internal Medicine | Admitting: Internal Medicine

## 2023-08-05 ENCOUNTER — Other Ambulatory Visit: Payer: Self-pay

## 2023-08-05 ENCOUNTER — Emergency Department: Payer: Self-pay

## 2023-08-05 ENCOUNTER — Inpatient Hospital Stay (INDEPENDENT_AMBULATORY_CARE_PROVIDER_SITE_OTHER): Payer: Self-pay

## 2023-08-05 ENCOUNTER — Observation Stay
Admission: EM | Admit: 2023-08-05 | Discharge: 2023-08-06 | Disposition: A | Discharge status: Home / Self Care | Payer: Self-pay | Attending: Obstetrics and Gynecology | Admitting: Obstetrics and Gynecology

## 2023-08-05 DIAGNOSIS — D72829 Elevated white blood cell count, unspecified: Secondary | ICD-10-CM | POA: Insufficient documentation

## 2023-08-05 DIAGNOSIS — R008 Other abnormalities of heart beat: Secondary | ICD-10-CM

## 2023-08-05 DIAGNOSIS — Z9889 Other specified postprocedural states: Secondary | ICD-10-CM | POA: Insufficient documentation

## 2023-08-05 DIAGNOSIS — F17200 Nicotine dependence, unspecified, uncomplicated: Secondary | ICD-10-CM | POA: Insufficient documentation

## 2023-08-05 DIAGNOSIS — S270XXA Traumatic pneumothorax, initial encounter: Principal | ICD-10-CM | POA: Insufficient documentation

## 2023-08-05 DIAGNOSIS — Z8719 Personal history of other diseases of the digestive system: Secondary | ICD-10-CM | POA: Insufficient documentation

## 2023-08-05 DIAGNOSIS — R109 Unspecified abdominal pain: Secondary | ICD-10-CM

## 2023-08-05 DIAGNOSIS — S0083XA Contusion of other part of head, initial encounter: Principal | ICD-10-CM

## 2023-08-05 DIAGNOSIS — R079 Chest pain, unspecified: Secondary | ICD-10-CM

## 2023-08-05 DIAGNOSIS — Z932 Ileostomy status: Secondary | ICD-10-CM | POA: Insufficient documentation

## 2023-08-05 DIAGNOSIS — E669 Obesity, unspecified: Secondary | ICD-10-CM | POA: Insufficient documentation

## 2023-08-05 DIAGNOSIS — S01111A Laceration without foreign body of right eyelid and periocular area, initial encounter: Secondary | ICD-10-CM | POA: Insufficient documentation

## 2023-08-05 DIAGNOSIS — J939 Pneumothorax, unspecified: Secondary | ICD-10-CM | POA: Diagnosis present

## 2023-08-05 DIAGNOSIS — Z6830 Body mass index (BMI) 30.0-30.9, adult: Secondary | ICD-10-CM | POA: Insufficient documentation

## 2023-08-05 DIAGNOSIS — R0789 Other chest pain: Secondary | ICD-10-CM | POA: Insufficient documentation

## 2023-08-05 LAB — CBC
HCT: 47.6 % (ref 39.0–52.0)
HCT: 39.1 % (ref 39.0–52.0)
Hemoglobin: 16.7 g/dL (ref 13.0–17.0)
Hemoglobin: 13.7 g/dL (ref 13.0–17.0)
MCH: 28.9 pg (ref 26.0–34.0)
MCH: 29.1 pg (ref 26.0–34.0)
MCHC: 35.1 g/dL (ref 30.0–36.0)
MCHC: 35 g/dL (ref 30.0–36.0)
MCV: 82.4 fL (ref 80.0–100.0)
MCV: 83.2 fL (ref 80.0–100.0)
Platelets: 452 K/uL — ABNORMAL HIGH (ref 150–400)
Platelets: 294 K/uL (ref 150–400)
RBC: 5.78 MIL/uL (ref 4.22–5.81)
RBC: 4.7 MIL/uL (ref 4.22–5.81)
RDW: 13 % (ref 11.5–15.5)
RDW: 13.1 % (ref 11.5–15.5)
WBC: 20.6 K/uL — ABNORMAL HIGH (ref 4.0–10.5)
WBC: 12.2 K/uL — ABNORMAL HIGH (ref 4.0–10.5)
nRBC: 0 % (ref 0.0–0.2)
nRBC: 0 % (ref 0.0–0.2)

## 2023-08-05 LAB — ECHOCARDIOGRAM COMPLETE
AR max vel: 2.39 cm2
AV Peak grad: 11.3 mmHg
Ao pk vel: 1.68 m/s
Area-P 1/2: 3.77 cm2
S' Lateral: 3.4 cm

## 2023-08-05 LAB — COMPREHENSIVE METABOLIC PANEL WITH GFR
ALT: 35 U/L (ref 0–44)
AST: 31 U/L (ref 15–41)
Albumin: 4.3 g/dL (ref 3.5–5.0)
Alkaline Phosphatase: 80 U/L (ref 38–126)
Anion gap: 18 — ABNORMAL HIGH (ref 5–15)
BUN: 8 mg/dL (ref 6–20)
CO2: 16 mmol/L — ABNORMAL LOW (ref 22–32)
Calcium: 9.1 mg/dL (ref 8.9–10.3)
Chloride: 105 mmol/L (ref 98–111)
Creatinine, Ser: 1.07 mg/dL (ref 0.61–1.24)
GFR, Estimated: >60 mL/min (ref >60)
Glucose, Bld: 131 mg/dL — ABNORMAL HIGH (ref 70–99)
Potassium: 4 mmol/L (ref 3.5–5.1)
Sodium: 139 mmol/L (ref 135–145)
Total Bilirubin: 0.4 mg/dL (ref 0.0–1.2)
Total Protein: 8.2 g/dL — ABNORMAL HIGH (ref 6.5–8.1)

## 2023-08-05 LAB — PROTIME-INR
INR: 1 (ref 0.8–1.2)
Prothrombin Time: 13.8 s (ref 11.4–15.2)

## 2023-08-05 LAB — BASIC METABOLIC PANEL WITH GFR
Anion gap: 8 (ref 5–15)
BUN: 13 mg/dL (ref 6–20)
CO2: 24 mmol/L (ref 22–32)
Calcium: 8.4 mg/dL — ABNORMAL LOW (ref 8.9–10.3)
Chloride: 105 mmol/L (ref 98–111)
Creatinine, Ser: 1.02 mg/dL (ref 0.61–1.24)
GFR, Estimated: >60 mL/min (ref >60)
Glucose, Bld: 99 mg/dL (ref 70–99)
Potassium: 3.6 mmol/L (ref 3.5–5.1)
Sodium: 137 mmol/L (ref 135–145)

## 2023-08-05 LAB — HIV ANTIBODY (ROUTINE TESTING W REFLEX): HIV Screen 4th Generation wRfx: NONREACTIVE

## 2023-08-05 LAB — APTT: aPTT: 30 s (ref 24–36)

## 2023-08-05 LAB — HEMOGLOBIN: Hemoglobin: 14.5 g/dL (ref 13.0–17.0)

## 2023-08-05 LAB — TROPONIN I (HIGH SENSITIVITY): Troponin I (High Sensitivity): 5 ng/L (ref <18)

## 2023-08-05 MED ORDER — ACETAMINOPHEN 325 MG PO TABS: 650.0000 mg | ORAL_TABLET | Freq: Four times a day (QID) | ORAL | Status: DC | PRN

## 2023-08-05 MED ORDER — HYDROMORPHONE HCL 1 MG/ML IJ SOLN
1.0000 mg | Freq: Once | INTRAMUSCULAR | Status: AC
  Administered 2023-08-05: 1 mg via INTRAVENOUS
  Filled 2023-08-05: qty 1

## 2023-08-05 MED ORDER — METOCLOPRAMIDE HCL 5 MG/ML IJ SOLN
10.0000 mg | Freq: Once | INTRAMUSCULAR | Status: AC
  Administered 2023-08-05: 10 mg via INTRAVENOUS
  Filled 2023-08-05: qty 2

## 2023-08-05 MED ORDER — ACETAMINOPHEN 650 MG RE SUPP: 650.0000 mg | Freq: Four times a day (QID) | RECTAL | Status: DC | PRN

## 2023-08-05 MED ORDER — MORPHINE SULFATE (PF) 4 MG/ML IV SOLN
4.0000 mg | Freq: Once | INTRAVENOUS | Status: AC
  Administered 2023-08-05: 4 mg via INTRAVENOUS
  Filled 2023-08-05: qty 1

## 2023-08-05 MED ORDER — OXYCODONE-ACETAMINOPHEN 5-325 MG PO TABS
1.0000 | ORAL_TABLET | Freq: Once | ORAL | Status: AC
  Administered 2023-08-05: 1 via ORAL
  Filled 2023-08-05: qty 1

## 2023-08-05 MED ORDER — ALBUTEROL SULFATE (2.5 MG/3ML) 0.083% IN NEBU: 2.5000 mg | INHALATION_SOLUTION | RESPIRATORY_TRACT | Status: DC | PRN

## 2023-08-05 MED ORDER — FENTANYL CITRATE PF 50 MCG/ML IJ SOSY
12.5000 ug | PREFILLED_SYRINGE | INTRAMUSCULAR | Status: DC | PRN
  Administered 2023-08-05: 50 ug via INTRAVENOUS
  Administered 2023-08-05: 25 ug via INTRAVENOUS
  Administered 2023-08-06 (×2): 50 ug via INTRAVENOUS
  Filled 2023-08-05 (×4): qty 1

## 2023-08-05 MED ORDER — PANTOPRAZOLE SODIUM 40 MG IV SOLR
40.0000 mg | INTRAVENOUS | Status: DC
  Administered 2023-08-05 – 2023-08-06 (×2): 40 mg via INTRAVENOUS
  Filled 2023-08-05 (×2): qty 10

## 2023-08-05 MED ORDER — ONDANSETRON HCL 4 MG/2ML IJ SOLN
4.0000 mg | Freq: Once | INTRAMUSCULAR | Status: AC
  Administered 2023-08-05: 4 mg via INTRAVENOUS
  Filled 2023-08-05: qty 2

## 2023-08-05 MED ORDER — IOHEXOL 350 MG/ML SOLN
100.0000 mL | Freq: Once | INTRAVENOUS | Status: AC | PRN
  Administered 2023-08-05: 100 mL via INTRAVENOUS

## 2023-08-05 MED ORDER — FAMOTIDINE 20 MG PO TABS
20.0000 mg | ORAL_TABLET | Freq: Two times a day (BID) | ORAL | Status: DC
  Administered 2023-08-05 – 2023-08-06 (×2): 20 mg via ORAL
  Filled 2023-08-05 (×3): qty 1

## 2023-08-05 MED ORDER — ACETAMINOPHEN 325 MG PO TABS
650.0000 mg | ORAL_TABLET | Freq: Once | ORAL | Status: AC
  Administered 2023-08-05: 650 mg via ORAL
  Filled 2023-08-05: qty 2

## 2023-08-05 MED ORDER — SODIUM CHLORIDE 0.9 % IV BOLUS
500.0000 mL | Freq: Once | INTRAVENOUS | Status: AC
  Administered 2023-08-05: 500 mL via INTRAVENOUS

## 2023-08-05 MED ORDER — OXYCODONE HCL 5 MG PO TABS
5.0000 mg | ORAL_TABLET | ORAL | Status: DC | PRN
  Administered 2023-08-05 – 2023-08-06 (×5): 5 mg via ORAL
  Filled 2023-08-05 (×6): qty 1

## 2023-08-05 MED ORDER — ONDANSETRON HCL 4 MG/2ML IJ SOLN: 4.0000 mg | Freq: Four times a day (QID) | INTRAMUSCULAR | Status: DC | PRN

## 2023-08-05 MED ORDER — SODIUM CHLORIDE 0.9 % IV BOLUS
1000.0000 mL | Freq: Once | INTRAVENOUS | Status: AC
  Administered 2023-08-05: 1000 mL via INTRAVENOUS

## 2023-08-05 MED ORDER — ONDANSETRON HCL 4 MG PO TABS: 4.0000 mg | ORAL_TABLET | Freq: Four times a day (QID) | ORAL | Status: DC | PRN
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
## 2023-08-05 NOTE — ED Provider Notes (Signed)
 Care assumed of patient from outgoing provider.  See their note for initial history, exam and plan.  Clinical Course as of 08/05/23 0756  Sat Aug 05, 2023  0709 Admit for pain control and trace pneumothorax.  No AC. No chest tube. CT trauma scans otherwise neg.  [SM]    Clinical Course User Index [SM] Suzanne Kirsch, MD   Messaged trauma surgery Dr. Cesar after discussion with hospitalist service.  Continues to be in pain despite multiple doses of IV pain medication.  Will give oral pain medication and Tylenol .  Ordered incentive spirometer.  Consulted hospitalist for admission for pain control requiring multiple doses of IV narcotics.   Suzanne Kirsch, MD 08/05/23 [phone]

## 2023-08-05 NOTE — Progress Notes (Signed)
  Echocardiogram 2D Echocardiogram has been performed.  Larry Yang 08/05/2023, 2:43 PM
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
## 2023-08-05 NOTE — Plan of Care (Signed)
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
## 2023-08-06 DIAGNOSIS — E669 Obesity, unspecified: Secondary | ICD-10-CM | POA: Insufficient documentation

## 2023-08-06 DIAGNOSIS — Z9889 Other specified postprocedural states: Secondary | ICD-10-CM | POA: Insufficient documentation

## 2023-08-06 DIAGNOSIS — S270XXA Traumatic pneumothorax, initial encounter: Principal | ICD-10-CM

## 2023-08-06 DIAGNOSIS — Z8719 Personal history of other diseases of the digestive system: Secondary | ICD-10-CM

## 2023-08-06 LAB — CBC
HCT: 40 % (ref 39.0–52.0)
Hemoglobin: 13.7 g/dL (ref 13.0–17.0)
MCH: 28.8 pg (ref 26.0–34.0)
MCHC: 34.3 g/dL (ref 30.0–36.0)
MCV: 84 fL (ref 80.0–100.0)
Platelets: 263 K/uL (ref 150–400)
RBC: 4.76 MIL/uL (ref 4.22–5.81)
RDW: 13 % (ref 11.5–15.5)
WBC: 7.5 K/uL (ref 4.0–10.5)
nRBC: 0 % (ref 0.0–0.2)

## 2023-08-06 LAB — COMPREHENSIVE METABOLIC PANEL WITH GFR
ALT: 24 U/L (ref 0–44)
AST: 22 U/L (ref 15–41)
Albumin: 3.6 g/dL (ref 3.5–5.0)
Alkaline Phosphatase: 69 U/L (ref 38–126)
Anion gap: 8 (ref 5–15)
BUN: 12 mg/dL (ref 6–20)
CO2: 26 mmol/L (ref 22–32)
Calcium: 8.5 mg/dL — ABNORMAL LOW (ref 8.9–10.3)
Chloride: 106 mmol/L (ref 98–111)
Creatinine, Ser: 0.89 mg/dL (ref 0.61–1.24)
GFR, Estimated: >60 mL/min (ref >60)
Glucose, Bld: 106 mg/dL — ABNORMAL HIGH (ref 70–99)
Potassium: 3.4 mmol/L — ABNORMAL LOW (ref 3.5–5.1)
Sodium: 140 mmol/L (ref 135–145)
Total Bilirubin: 0.9 mg/dL (ref 0.0–1.2)
Total Protein: 6.7 g/dL (ref 6.5–8.1)

## 2023-08-06 MED ORDER — OXYCODONE HCL 5 MG PO TABS: 5.0000 mg | ORAL_TABLET | ORAL | 0 refills | Status: AC | PRN

## 2023-08-06 NOTE — Discharge Instructions (Signed)

## 2023-08-06 NOTE — Plan of Care (Signed)
  Problem: Education: Goal: Knowledge of General Education information will improve Description: Including pain rating scale, medication(s)/side effects and non-pharmacologic comfort measures Outcome: Adequate for Discharge   Problem: Health Behavior/Discharge Planning: Goal: Ability to manage health-related needs will improve Outcome: Adequate for Discharge   Problem: Clinical Measurements: Goal: Ability to maintain clinical measurements within normal limits will improve Outcome: Adequate for Discharge Goal: Will remain free from infection Outcome: Adequate for Discharge Goal: Diagnostic test results will improve Outcome: Adequate for Discharge Goal: Respiratory complications will improve Outcome: Adequate for Discharge Goal: Cardiovascular complication will be avoided Outcome: Adequate for Discharge   Problem: Activity: Goal: Risk for activity intolerance will decrease Outcome: Adequate for Discharge   Problem: Nutrition: Goal: Adequate nutrition will be maintained Outcome: Adequate for Discharge   Problem: Coping: Goal: Level of anxiety will decrease Outcome: Adequate for Discharge   Problem: Elimination: Goal: Will not experience complications related to bowel motility Outcome: Adequate for Discharge Goal: Will not experience complications related to urinary retention Outcome: Adequate for Discharge   Problem: Pain Managment: Goal: General experience of comfort will improve and/or be controlled Outcome: Adequate for Discharge   Problem: Safety: Goal: Ability to remain free from injury will improve Outcome: Adequate for Discharge   Problem: Skin Integrity: Goal: Risk for impaired skin integrity will decrease Outcome: Adequate for Discharge  PT d/c to self home, IV removed AVS reviewed no follow up questions belongings returned. BP (!) 147/93 (BP Location: Right Arm)   Pulse 76   Temp 98.2 F (36.8 C) (Oral)   Resp 16   SpO2 100%  No further questions for  AVS.  Larry Yang 08/06/23 10:38 AM

## 2023-08-06 NOTE — Plan of Care (Signed)

## 2023-08-06 NOTE — TOC CM/SW Note (Signed)
 Patient has orders to DC. Patient is listed as uninsured, no PCP listed. Uninsured resources added to the AVS.    Deandrae Wajda, LCSW [phone]
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
